# Patient Record
Sex: Male | Born: 1980 | Race: Black or African American | Hispanic: No | Smoking: Current every day smoker
Health system: Southern US, Community
[De-identification: ages and names within clinical notes are randomized; demographics above are authoritative.]

## PROBLEM LIST (undated history)

## (undated) HISTORY — PX: HERNIA REPAIR: SHX51

---

## 2014-09-04 ENCOUNTER — Ambulatory Visit (INDEPENDENT_AMBULATORY_CARE_PROVIDER_SITE_OTHER): Payer: BLUE CROSS/BLUE SHIELD | Admitting: Family Medicine

## 2014-09-04 VITALS — BP 110/78 | HR 65 | Temp 97.6°F | Resp 18 | Ht 74.3 in | Wt 272.0 lb

## 2014-09-04 DIAGNOSIS — Z9889 Other specified postprocedural states: Secondary | ICD-10-CM | POA: Diagnosis not present

## 2014-09-04 DIAGNOSIS — Z8719 Personal history of other diseases of the digestive system: Secondary | ICD-10-CM

## 2014-09-04 DIAGNOSIS — R1031 Right lower quadrant pain: Secondary | ICD-10-CM

## 2014-09-04 DIAGNOSIS — S76219A Strain of adductor muscle, fascia and tendon of unspecified thigh, initial encounter: Secondary | ICD-10-CM

## 2014-09-04 DIAGNOSIS — S39011A Strain of muscle, fascia and tendon of abdomen, initial encounter: Secondary | ICD-10-CM

## 2014-09-04 MED ORDER — DICLOFENAC SODIUM 75 MG PO TBEC: 75.0000 mg | DELAYED_RELEASE_TABLET | Freq: Two times a day (BID) | ORAL | Status: AC

## 2014-09-04 NOTE — Patient Instructions (Signed)
Take the diclofenac one pill twice daily for pain and inflammation of groin. (This is comparable to a strong ibuprofen or Aleve, and you should not take either of them all you're on this. (  Can take additional Tylenol 2 pills 3 or 4 times daily if needed for further pain relief.  Return at any time if worse  If not improving over the next couple of weeks you should return for further assessment and consideration of making a referral to a surgeon for evaluation for a possible developing hernia  Stay off work through Wednesday. When you return Thursday and Friday try to avoid excessive lifting and straining.

## 2014-09-04 NOTE — Progress Notes (Signed)
Subjective: 34 year old man who has been having some pain in the right inguinal region for the last couple of weeks, but it is been pretty consistent the last 2 days. He knows of no specific injury. 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 He does not currently work out at a gym since his child was born 9 months ago. He does have a history of an inguinal hernia repair when he was in infancy. Otherwise feels like he is pretty healthy except for a history of an old injury to it to his neck causes some pain shooting down into his shoulder still. The groin pain does shoot down his medial right thigh.  Objective: Healthy-appearing young man. When he moves around a lot he grunts and groans. His abdomen is soft without mass or tenderness. He says he did have problems with some excessively loose stools, and did see a gastroenterologist. Otherwise abdomen is soft without any lesions. Normal male external genitalia. No hernias grossly. He is tender in the right groin area in the medial groin. Movement of right hip cause a little bit of pain. He is able to move around well with his spine though it also causes some grunting.  Assessment: Right groin pain and strain versus developing hernia  Plan: Treat symptomatically with anti-inflammatory medication for a couple of weeks. He is to stay off work for couple of days while this starts calming things down. He is to try and be careful with his lifting and straining at work. If he is not doing substantially better over the next 2 weeks may need to send him to a general surgeon for repeat and further assessment. I do not feel a hernia at this time, but he could be trying to develop 1.  See instructions

## 2015-05-23 ENCOUNTER — Encounter (HOSPITAL_COMMUNITY): Payer: Self-pay | Admitting: Emergency Medicine

## 2015-05-23 ENCOUNTER — Emergency Department (HOSPITAL_COMMUNITY)
Admission: EM | Admit: 2015-05-23 | Discharge: 2015-05-24 | Disposition: A | Discharge status: Home / Self Care | Payer: Self-pay | Attending: Emergency Medicine | Admitting: Emergency Medicine

## 2015-05-23 DIAGNOSIS — R509 Fever, unspecified: Secondary | ICD-10-CM | POA: Insufficient documentation

## 2015-05-23 DIAGNOSIS — F17200 Nicotine dependence, unspecified, uncomplicated: Secondary | ICD-10-CM | POA: Insufficient documentation

## 2015-05-23 DIAGNOSIS — R197 Diarrhea, unspecified: Secondary | ICD-10-CM | POA: Insufficient documentation

## 2015-05-23 DIAGNOSIS — R1084 Generalized abdominal pain: Secondary | ICD-10-CM | POA: Insufficient documentation

## 2015-05-23 LAB — COMPREHENSIVE METABOLIC PANEL
ALK PHOS: 59 U/L (ref 38–126)
ALT: 36 U/L (ref 17–63)
ANION GAP: 11 (ref 5–15)
AST: 29 U/L (ref 15–41)
Albumin: 4 g/dL (ref 3.5–5.0)
BILIRUBIN TOTAL: 0.9 mg/dL (ref 0.3–1.2)
BUN: 12 mg/dL (ref 6–20)
CALCIUM: 9.9 mg/dL (ref 8.9–10.3)
CO2: 23 mmol/L (ref 22–32)
Chloride: 100 mmol/L — ABNORMAL LOW (ref 101–111)
Creatinine, Ser: 1.61 mg/dL — ABNORMAL HIGH (ref 0.61–1.24)
GFR calc Af Amer: >60 mL/min (ref >60)
GFR, EST NON AFRICAN AMERICAN: 55 mL/min — AB (ref >60)
GLUCOSE: 127 mg/dL — AB (ref 65–99)
POTASSIUM: 3.6 mmol/L (ref 3.5–5.1)
Sodium: 134 mmol/L — ABNORMAL LOW (ref 135–145)
Total Protein: 7.9 g/dL (ref 6.5–8.1)

## 2015-05-23 LAB — URINALYSIS, ROUTINE W REFLEX MICROSCOPIC
GLUCOSE, UA: NEGATIVE mg/dL
Ketones, ur: 15 mg/dL — AB
LEUKOCYTES UA: NEGATIVE
Nitrite: NEGATIVE
PH: 6 (ref 5.0–8.0)
Protein, ur: NEGATIVE mg/dL
SPECIFIC GRAVITY, URINE: 1.031 — AB (ref 1.005–1.030)

## 2015-05-23 LAB — CBC
HEMATOCRIT: 43.3 % (ref 39.0–52.0)
Hemoglobin: 14.5 g/dL (ref 13.0–17.0)
MCH: 29.5 pg (ref 26.0–34.0)
MCHC: 33.5 g/dL (ref 30.0–36.0)
MCV: 88 fL (ref 78.0–100.0)
Platelets: 250 10*3/uL (ref 150–400)
RBC: 4.92 MIL/uL (ref 4.22–5.81)
RDW: 12.9 % (ref 11.5–15.5)
WBC: 11 10*3/uL — AB (ref 4.0–10.5)

## 2015-05-23 LAB — URINE MICROSCOPIC-ADD ON
BACTERIA UA: NONE SEEN
Squamous Epithelial / LPF: NONE SEEN

## 2015-05-23 LAB — LIPASE, BLOOD: Lipase: 16 U/L (ref 11–51)

## 2015-05-23 MED ORDER — ACETAMINOPHEN 325 MG PO TABS
ORAL_TABLET | ORAL | Status: AC
  Filled 2015-05-23: qty 2

## 2015-05-23 MED ORDER — ACETAMINOPHEN 325 MG PO TABS
650.0000 mg | ORAL_TABLET | Freq: Once | ORAL | Status: AC
  Administered 2015-05-23: 650 mg via ORAL

## 2015-05-23 NOTE — ED Notes (Signed)
Pt. reports diarrhea , generalized body aches , headache , fever and chills onset 3 days ago .

## 2015-05-24 ENCOUNTER — Emergency Department (HOSPITAL_COMMUNITY): Payer: Self-pay

## 2015-05-24 ENCOUNTER — Encounter (HOSPITAL_COMMUNITY): Payer: Self-pay | Admitting: Radiology

## 2015-05-24 LAB — I-STAT CHEM 8, ED
BUN: 10 mg/dL (ref 6–20)
CALCIUM ION: 1.12 mmol/L (ref 1.12–1.23)
CREATININE: 1.1 mg/dL (ref 0.61–1.24)
Chloride: 106 mmol/L (ref 101–111)
Glucose, Bld: 129 mg/dL — ABNORMAL HIGH (ref 65–99)
HCT: 42 % (ref 39.0–52.0)
HEMOGLOBIN: 14.3 g/dL (ref 13.0–17.0)
Potassium: 3.6 mmol/L (ref 3.5–5.1)
SODIUM: 138 mmol/L (ref 135–145)
TCO2: 20 mmol/L (ref 0–100)

## 2015-05-24 MED ORDER — ACETAMINOPHEN 325 MG PO TABS
650.0000 mg | ORAL_TABLET | Freq: Once | ORAL | Status: AC
  Administered 2015-05-24: 650 mg via ORAL
  Filled 2015-05-24: qty 2

## 2015-05-24 MED ORDER — ONDANSETRON HCL 4 MG PO TABS: 4.0000 mg | ORAL_TABLET | Freq: Four times a day (QID) | ORAL | Status: AC

## 2015-05-24 MED ORDER — DICYCLOMINE HCL 20 MG PO TABS: 20.0000 mg | ORAL_TABLET | Freq: Two times a day (BID) | ORAL | Status: AC | PRN

## 2015-05-24 MED ORDER — IOHEXOL 300 MG/ML  SOLN
100.0000 mL | Freq: Once | INTRAMUSCULAR | Status: AC | PRN
  Administered 2015-05-24: 100 mL via INTRAVENOUS

## 2015-05-24 MED ORDER — DICYCLOMINE HCL 10 MG PO CAPS
10.0000 mg | ORAL_CAPSULE | Freq: Once | ORAL | Status: AC
  Administered 2015-05-24: 10 mg via ORAL
  Filled 2015-05-24: qty 1

## 2015-05-24 MED ORDER — SODIUM CHLORIDE 0.9 % IV BOLUS (SEPSIS)
1000.0000 mL | Freq: Once | INTRAVENOUS | Status: AC
  Administered 2015-05-24: 1000 mL via INTRAVENOUS

## 2015-05-24 MED ORDER — ONDANSETRON HCL 4 MG/2ML IJ SOLN
4.0000 mg | Freq: Once | INTRAMUSCULAR | Status: AC
  Administered 2015-05-24: 4 mg via INTRAVENOUS
  Filled 2015-05-24: qty 2

## 2015-05-24 MED ORDER — SODIUM CHLORIDE 0.9 % IV BOLUS (SEPSIS)
2000.0000 mL | Freq: Once | INTRAVENOUS | Status: AC
  Administered 2015-05-24: 2000 mL via INTRAVENOUS

## 2015-05-24 NOTE — ED Provider Notes (Signed)
CSN: [id number]     Arrival date & time 05/23/15  1831 History  By signing my name below, I, Jarvis Morgan, attest that this documentation has been prepared under the direction and in the presence of No att. providers found. Electronically Signed: Jarvis Morgan, ED Scribe. 05/25/2015. 2:10 PM.    Chief Complaint  Patient presents with  . Diarrhea  . Generalized Body Aches   The history is provided by the patient. No language interpreter was used.    HPI Comments: Benjamin Zuniga is a 34 y.o. male who presents to the Emergency Department complaining of intermittent, episodic, moderate, diarrhea onset 2 days. Pt has had watery diarrhea x4 today. He reports associated generalized body aches, HA, fever, chills and generalized abdominal pain with applied pressure onset 2 days. Pt took Tylenol 5 hours ago with no significant relief. He states the abdominal pain gets worse prior to having a bowel movement. He denies any known sick contacts. He has not eaten or drank much today. Pt denies nay recent travel. He denies any recent abx use. Pt denies any hematochezia.   History reviewed. No pertinent past medical history. Past Surgical History  Procedure Laterality Date  . Hernia repair     Family History  Problem Relation Age of Onset  . Heart disease Father    Social History  Substance Use Topics  . Smoking status: Current Every Day Smoker  . Smokeless tobacco: None  . Alcohol Use: 0.0 oz/week    0 Standard drinks or equivalent per week    Review of Systems  Constitutional: Positive for fever and chills.  HENT: Negative for congestion and postnasal drip.   Eyes: Negative for pain and redness.  Respiratory: Negative for cough, chest tightness and shortness of breath.   Cardiovascular: Negative for chest pain and palpitations.  Gastrointestinal: Positive for abdominal pain and diarrhea. Negative for blood in stool.  Genitourinary: Negative for dysuria, urgency, frequency and decreased  urine volume.  Musculoskeletal: Positive for myalgias (generalized). Negative for back pain.  Skin: Negative for rash.  Neurological: Negative for dizziness, weakness, light-headedness and numbness.  Hematological: Does not bruise/bleed easily.      Allergies  Review of patient's allergies indicates no known allergies.  Home Medications   Prior to Admission medications   Medication Sig Start Date End Date Taking? Authorizing Provider  naproxen sodium (ANAPROX) 220 MG tablet Take 220 mg by mouth 2 (two) times daily as needed (for pan).   Yes Historical Provider, MD  diclofenac (VOLTAREN) 75 MG EC tablet Take 1 tablet (75 mg total) by mouth 2 (two) times daily. Patient not taking: Reported on 05/23/2015 09/04/14   Peyton Najjar, MD  dicyclomine (BENTYL) 20 MG tablet Take 1 tablet (20 mg total) by mouth 2 (two) times daily as needed for spasms (abdominal pain). 05/24/15   Leta Baptist, MD  ondansetron (ZOFRAN) 4 MG tablet Take 1 tablet (4 mg total) by mouth every 6 (six) hours. 05/24/15   Leta Baptist, MD   Triage Vitals: BP 129/70 mmHg  Pulse 111  Temp(Src) 102.3 F (39.1 C) (Oral)  Resp 17  Ht  (1.88 m)  Wt 276 lb (125.193 kg)  BMI 35.42 kg/m2  SpO2 99%  Physical Exam  Constitutional: He is oriented to person, place, and time. He appears well-developed and well-nourished. No distress.  HENT:  Head: Normocephalic and atraumatic.  Eyes: Conjunctivae and EOM are normal.  Neck: Neck supple. No tracheal deviation present.  Cardiovascular: Normal rate,  regular rhythm and normal heart sounds.   Pulmonary/Chest: Effort normal. No respiratory distress.  Abdominal: Soft. There is generalized tenderness (mild to moderate lower abdominal bilaterally). There is no rebound and no guarding.  Musculoskeletal: Normal range of motion.  Neurological: He is alert and oriented to person, place, and time.  Skin: Skin is warm and dry.  Psychiatric: He has a normal mood and affect.  His behavior is normal.  Nursing note and vitals reviewed.   ED Course  Procedures (including critical care time)  DIAGNOSTIC STUDIES: Oxygen Saturation is 99% on RA, normal by my interpretation.    COORDINATION OF CARE: 7:19 PM- Will order diagnostic blood work, IV fluids and Tylenol .  Pt advised of plan for treatment and pt agrees.  Labs Review Labs Reviewed  COMPREHENSIVE METABOLIC PANEL - Abnormal; Notable for the following:    Sodium 134 (*)    Chloride 100 (*)    Glucose, Bld 127 (*)    Creatinine, Ser 1.61 (*)    GFR calc non Af Amer 55 (*)    All other components within normal limits  CBC - Abnormal; Notable for the following:    WBC 11.0 (*)    All other components within normal limits  URINALYSIS, ROUTINE W REFLEX MICROSCOPIC (NOT AT [facility]) - Abnormal; Notable for the following:    Color, Urine AMBER (*)    Specific Gravity, Urine 1.031 (*)    Hgb urine dipstick MODERATE (*)    Bilirubin Urine SMALL (*)    Ketones, ur 15 (*)    All other components within normal limits  I-STAT CHEM 8, ED - Abnormal; Notable for the following:    Glucose, Bld 129 (*)    All other components within normal limits  GASTROINTESTINAL PANEL BY PCR, STOOL (REPLACES STOOL CULTURE)  LIPASE, BLOOD  URINE MICROSCOPIC-ADD ON    Imaging Review Ct Abdomen Pelvis W Contrast  05/24/2015  CLINICAL DATA:  Acute onset of generalized body aches, headache, fever, chills and diarrhea. Initial encounter. EXAM: CT ABDOMEN AND PELVIS WITH CONTRAST TECHNIQUE: Multidetector CT imaging of the abdomen and pelvis was performed using the standard protocol following bolus administration of intravenous contrast. CONTRAST:  OMNIPAQUE IOHEXOL 300 MG/ML  SOLN COMPARISON:  None. FINDINGS: The visualized lung bases are clear. The liver and spleen are unremarkable in appearance. The gallbladder is within normal limits. The pancreas and adrenal glands are unremarkable. The kidneys are unremarkable in  appearance. There is no evidence of hydronephrosis. No renal or ureteral stones are seen. No perinephric stranding is appreciated. No free fluid is identified. The small bowel is unremarkable in appearance. The stomach is within normal limits. No acute vascular abnormalities are seen. The appendix is normal in caliber, without evidence of appendicitis. The colon is unremarkable in appearance. The bladder is mildly distended and grossly unremarkable. The prostate remains normal in size. No inguinal lymphadenopathy is seen. No acute osseous abnormalities are identified. IMPRESSION: Unremarkable contrast-enhanced CT of the abdomen and pelvis. Electronically Signed   By: Roanna Raider M.D.   On: 05/24/2015 05:16   I have personally reviewed and evaluated these lab results as part of my medical decision-making.   EKG Interpretation None      MDM  Patient was seen and evaluated in stable condition.  Patient febrile and mildly tachycardic on arrival.  Patient with mild lower abdominal tenderness.  Labs with mild leukocytosis, AKI.  PAtient hydrated with IV fluids and given bentyl for abdominal cramping.  CT abdomen/pelvis  negative for acute process.  Patient tolerating PO.  Cr normalized after hydration.  He felt improved after treatment and expressed understanding and agreement with plan for discharged and outpatient follow up.  Strict return precautions given.  Patient discharged home in stable condition. Final diagnoses:  Acute diarrhea    1. Acute diarrhea  I personally performed the services described in this documentation, which was scribed in my presence. The recorded information has been reviewed and is accurate.   Leta BaptistEmily Roe Nguyen, MD 05/25/15 [phone]

## 2015-05-24 NOTE — ED Notes (Signed)
Patient transported to CT 

## 2015-05-24 NOTE — Discharge Instructions (Signed)
You were seen today for your diarrhea and abdominal pain.  Take the medications as prescribed to help with symptom control.  Keep yourself well hydrated and rest as much as you can.  Wash your hands frequently.  Return if you are unable to tolerate drinking sufficient fluids.  Diarrhea Diarrhea is frequent loose and watery bowel movements. It can cause you to feel weak and dehydrated. Dehydration can cause you to become tired and thirsty, have a dry mouth, and have decreased urination that often is dark yellow. Diarrhea is a sign of another problem, most often an infection that will not last long. In most cases, diarrhea typically lasts 2-3 days. However, it can last longer if it is a sign of something more serious. It is important to treat your diarrhea as directed by your caregiver to lessen or prevent future episodes of diarrhea. CAUSES  Some common causes include:  Gastrointestinal infections caused by viruses, bacteria, or parasites.  Food poisoning or food allergies.  Certain medicines, such as antibiotics, chemotherapy, and laxatives.  Artificial sweeteners and fructose.  Digestive disorders. HOME CARE INSTRUCTIONS  Ensure adequate fluid intake (hydration): Have 1 cup (8 oz) of fluid for each diarrhea episode. Avoid fluids that contain simple sugars or sports drinks, fruit juices, whole milk products, and sodas. Your urine should be clear or pale yellow if you are drinking enough fluids. Hydrate with an oral rehydration solution that you can purchase at pharmacies, retail stores, and online. You can prepare an oral rehydration solution at home by mixing the following ingredients together:   - tsp table salt.   tsp baking soda.   tsp salt substitute containing potassium chloride.  1  tablespoons sugar.  1 L (34 oz) of water.  Certain foods and beverages may increase the speed at which food moves through the gastrointestinal (GI) tract. These foods and beverages should be avoided  and include:  Caffeinated and alcoholic beverages.  High-fiber foods, such as raw fruits and vegetables, nuts, seeds, and whole grain breads and cereals.  Foods and beverages sweetened with sugar alcohols, such as xylitol, sorbitol, and mannitol.  Some foods may be well tolerated and may help thicken stool including:  Starchy foods, such as rice, toast, pasta, low-sugar cereal, oatmeal, grits, baked potatoes, crackers, and bagels.  Bananas.  Applesauce.  Add probiotic-rich foods to help increase healthy bacteria in the GI tract, such as yogurt and fermented milk products.  Wash your hands well after each diarrhea episode.  Only take over-the-counter or prescription medicines as directed by your caregiver.  Take a warm bath to relieve any burning or pain from frequent diarrhea episodes. SEEK IMMEDIATE MEDICAL CARE IF:   You are unable to keep fluids down.  You have persistent vomiting.  You have blood in your stool, or your stools are black and tarry.  You do not urinate in 6-8 hours, or there is only a small amount of very dark urine.  You have abdominal pain that increases or localizes.  You have weakness, dizziness, confusion, or light-headedness.  You have a severe headache.  Your diarrhea gets worse or does not get better.  You have a fever or persistent symptoms for more than 2-3 days.  You have a fever and your symptoms suddenly get worse. MAKE SURE YOU:   Understand these instructions.  Will watch your condition.  Will get help right away if you are not doing well or get worse.   This information is not intended to replace advice given  to you by your health care provider. Make sure you discuss any questions you have with your health care provider.   Document Released: 05/02/2002 Document Revised: 06/02/2014 Document Reviewed: 01/18/2012 Elsevier Interactive Patient Education [employer].

## 2016-04-25 DEATH — deceased

## 2016-05-06 IMAGING — CT CT ABD-PELV W/ CM
2 of 4 series · 16 of 46 positions shown, 18 images · IV contrast (Omni 300)
Comparison: None.

CLINICAL DATA: Acute onset of generalized body aches, headache,
fever, chills and diarrhea. Initial encounter.

EXAM:
CT ABDOMEN AND PELVIS WITH CONTRAST
TECHNIQUE: Multidetector CT imaging of the abdomen and pelvis was performed
using the standard protocol following bolus administration of
intravenous contrast.
CONTRAST:  100mL OMNIPAQUE IOHEXOL 300 MG/ML  SOLN

[Series 2: a/p w/ 5mm · axial · 0.86mm/px · z∈[-613,-73]mm · 13 of 120 slices shown, 15 images]
[im 6/120  soft-tissue]
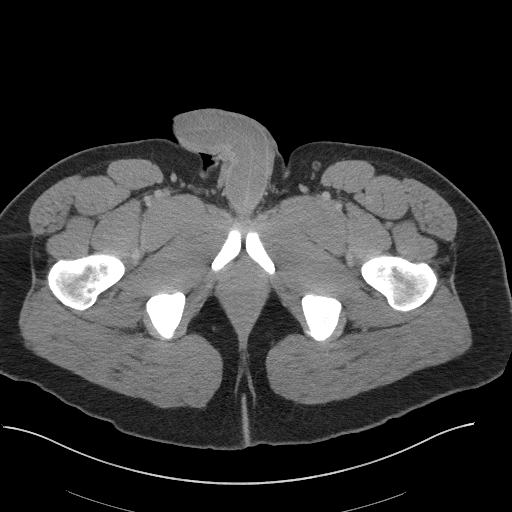
[im 6/120  bone]
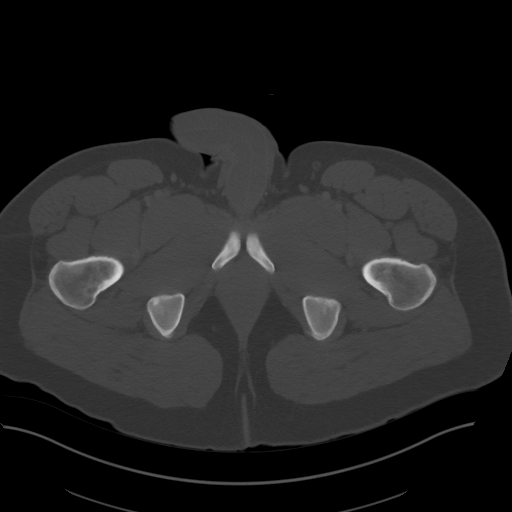
[im 17/120  soft-tissue]
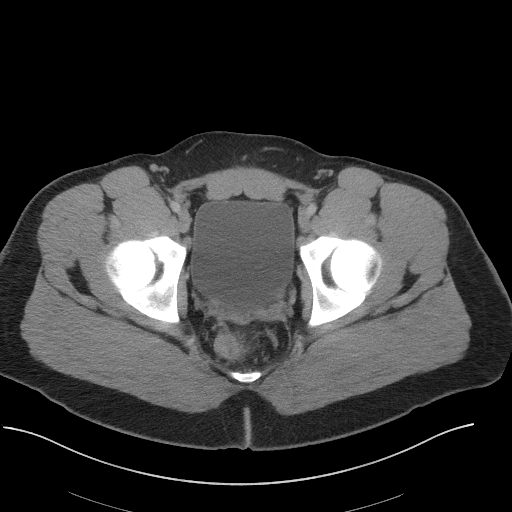
[im 28/120  soft-tissue]
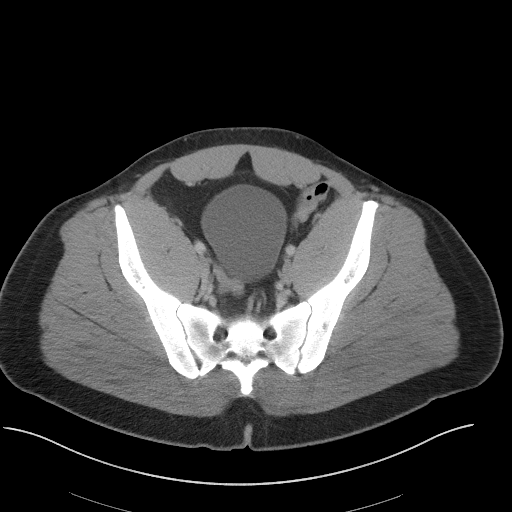
[im 33/120  soft-tissue]
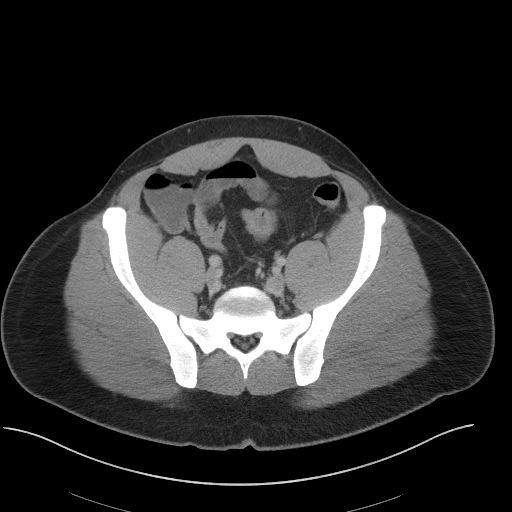
[im 44/120  soft-tissue]
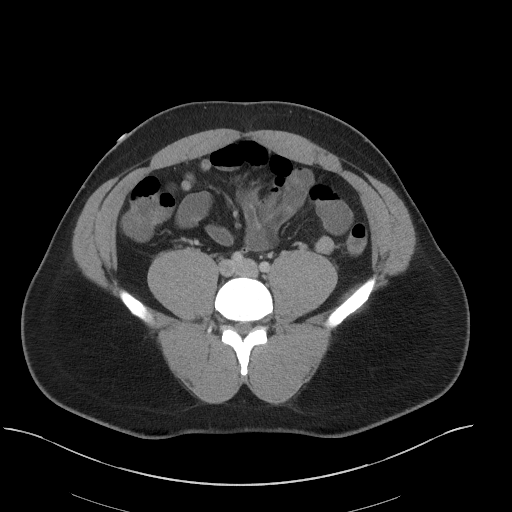
[im 49/120  soft-tissue]
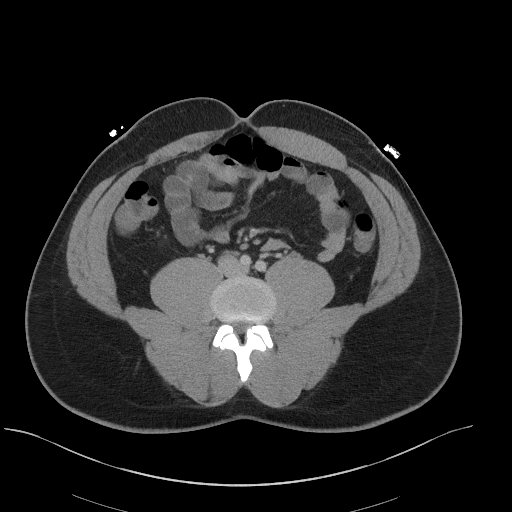
[im 60/120  soft-tissue]
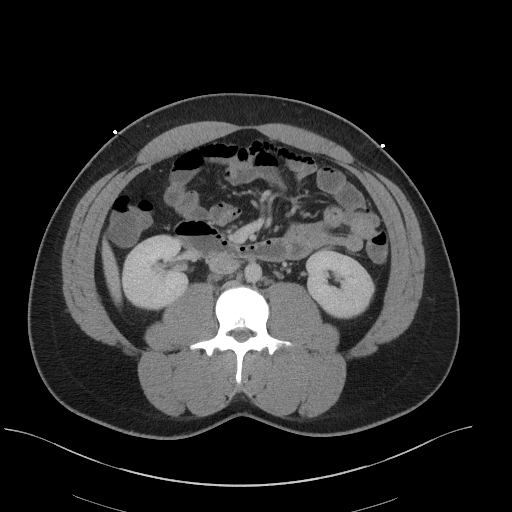
[im 71/120  soft-tissue]
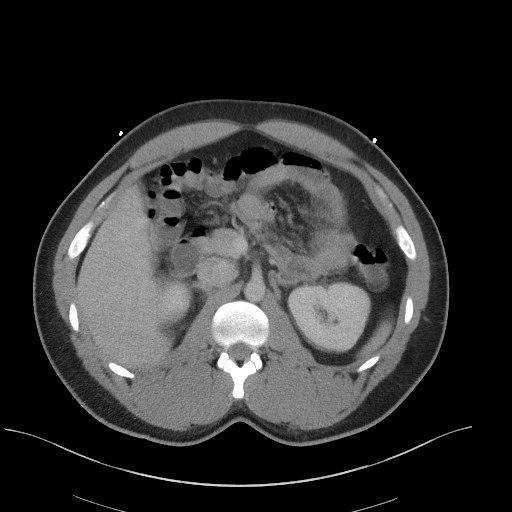
[im 76/120  soft-tissue]
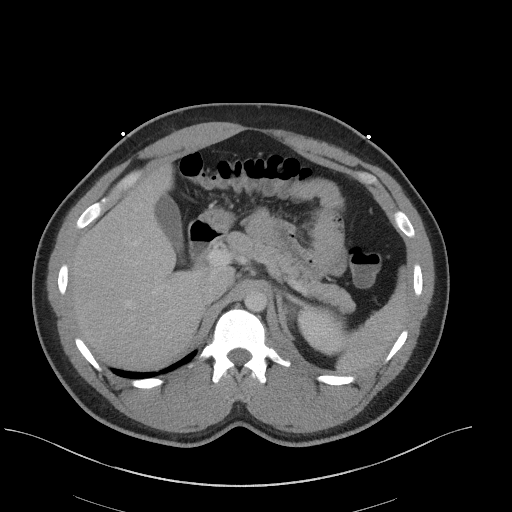
[im 76/120  bone]
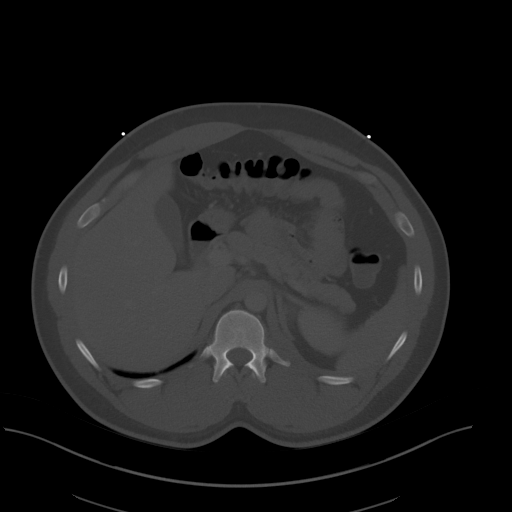
[im 87/120  soft-tissue]
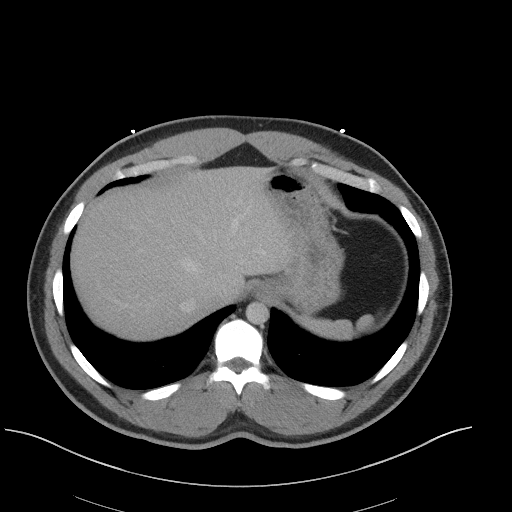
[im 92/120  soft-tissue]
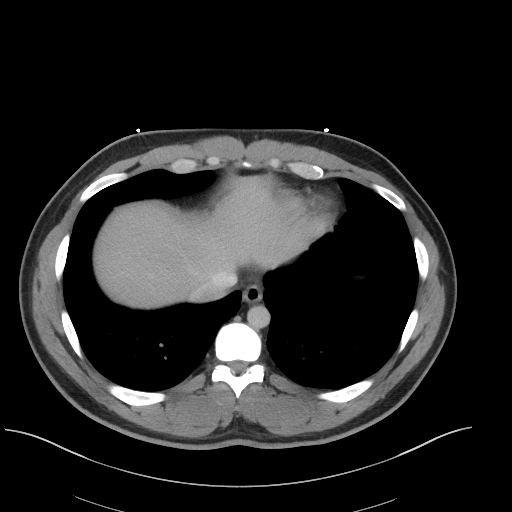
[im 103/120  soft-tissue]
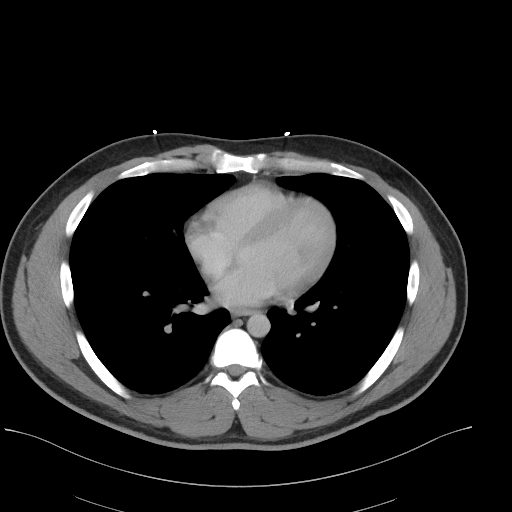
[im 114/120  soft-tissue]
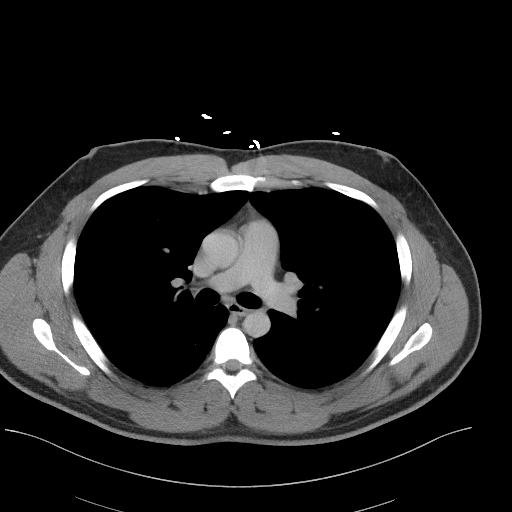

[Series 5: a/p w/ cor · coronal · 0.83mm/px · 3 of 143 slices shown]
[im 48/143  soft-tissue]
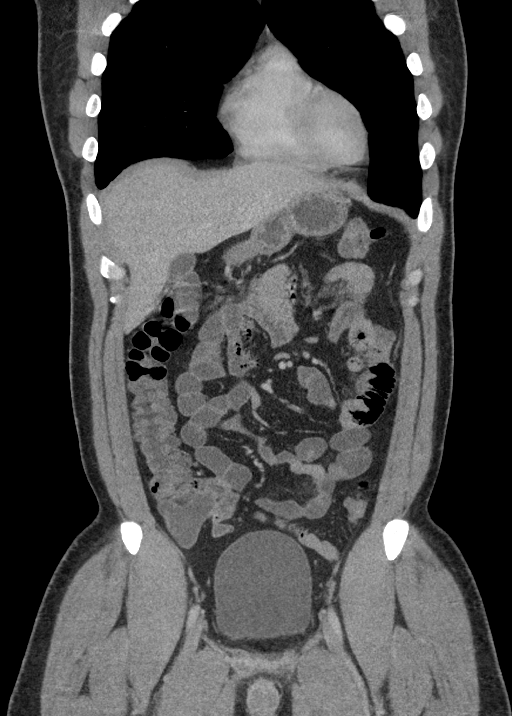
[im 64/143  soft-tissue]
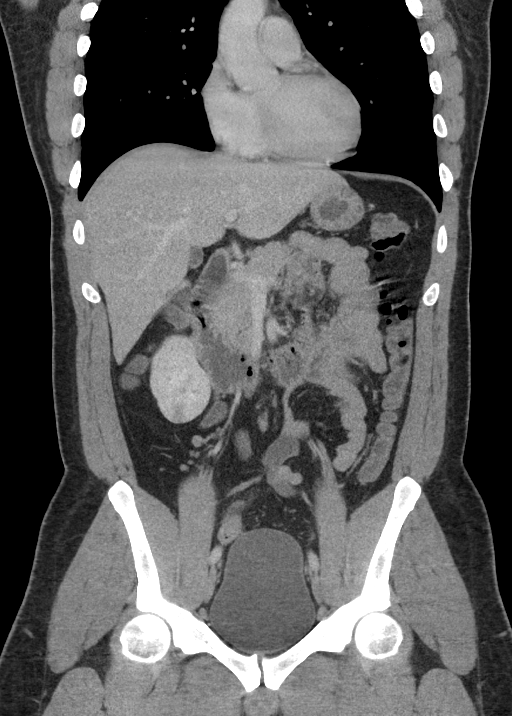
[im 79/143  soft-tissue]
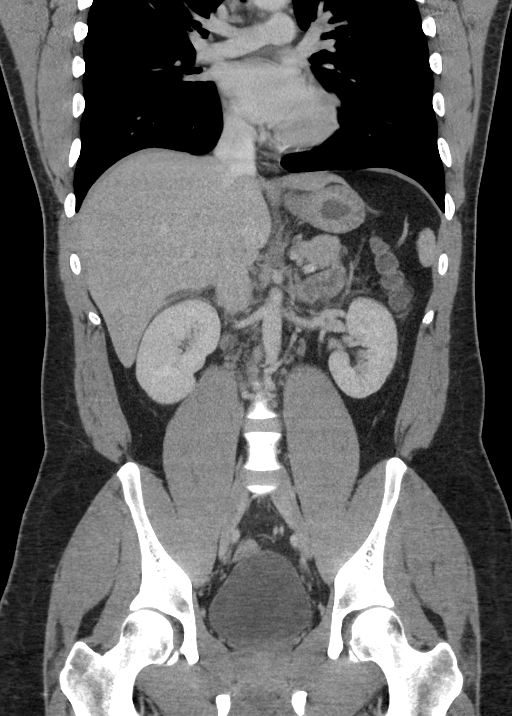

[16 of 46 positions shown; findings below may reference images not displayed]

FINDINGS: The visualized lung bases are clear.

The liver and spleen are unremarkable in appearance. The gallbladder
is within normal limits. The pancreas and adrenal glands are
unremarkable.

The kidneys are unremarkable in appearance. There is no evidence of
hydronephrosis. No renal or ureteral stones are seen. No perinephric
stranding is appreciated.

No free fluid is identified. The small bowel is unremarkable in
appearance. The stomach is within normal limits. No acute vascular
abnormalities are seen.

The appendix is normal in caliber, without evidence of appendicitis.
The colon is unremarkable in appearance.

The bladder is mildly distended and grossly unremarkable. The
prostate remains normal in size. No inguinal lymphadenopathy is
seen.

No acute osseous abnormalities are identified.
IMPRESSION: Unremarkable contrast-enhanced CT of the abdomen and pelvis.
